# Patient Record
Sex: Male | Born: 1965 | Race: White | Hispanic: No | Marital: Single | Smoking: Current every day smoker
Health system: Southern US, Community
[De-identification: ages and names within clinical notes are randomized; demographics above are authoritative.]

## PROBLEM LIST (undated history)

## (undated) DIAGNOSIS — R000 Tachycardia, unspecified: Secondary | ICD-10-CM

## (undated) DIAGNOSIS — J45909 Unspecified asthma, uncomplicated: Secondary | ICD-10-CM

## (undated) DIAGNOSIS — I10 Essential (primary) hypertension: Secondary | ICD-10-CM

---

## 2013-03-01 ENCOUNTER — Emergency Department (HOSPITAL_COMMUNITY)
Admission: EM | Admit: 2013-03-01 | Discharge: 2013-03-01 | Disposition: A | Discharge status: Home / Self Care | Payer: 59 | Attending: Emergency Medicine | Admitting: Emergency Medicine

## 2013-03-01 ENCOUNTER — Emergency Department (HOSPITAL_COMMUNITY): Payer: 59

## 2013-03-01 ENCOUNTER — Encounter (HOSPITAL_COMMUNITY): Payer: Self-pay | Admitting: Emergency Medicine

## 2013-03-01 DIAGNOSIS — T148XXA Other injury of unspecified body region, initial encounter: Secondary | ICD-10-CM

## 2013-03-01 DIAGNOSIS — S0100XA Unspecified open wound of scalp, initial encounter: Secondary | ICD-10-CM | POA: Insufficient documentation

## 2013-03-01 DIAGNOSIS — S0003XA Contusion of scalp, initial encounter: Secondary | ICD-10-CM | POA: Insufficient documentation

## 2013-03-01 DIAGNOSIS — I10 Essential (primary) hypertension: Secondary | ICD-10-CM | POA: Insufficient documentation

## 2013-03-01 DIAGNOSIS — S0180XA Unspecified open wound of other part of head, initial encounter: Secondary | ICD-10-CM | POA: Insufficient documentation

## 2013-03-01 DIAGNOSIS — S0083XA Contusion of other part of head, initial encounter: Secondary | ICD-10-CM

## 2013-03-01 DIAGNOSIS — F17200 Nicotine dependence, unspecified, uncomplicated: Secondary | ICD-10-CM | POA: Insufficient documentation

## 2013-03-01 DIAGNOSIS — S1093XA Contusion of unspecified part of neck, initial encounter: Secondary | ICD-10-CM

## 2013-03-01 DIAGNOSIS — IMO0002 Reserved for concepts with insufficient information to code with codable children: Secondary | ICD-10-CM

## 2013-03-01 DIAGNOSIS — Z23 Encounter for immunization: Secondary | ICD-10-CM | POA: Insufficient documentation

## 2013-03-01 DIAGNOSIS — R55 Syncope and collapse: Secondary | ICD-10-CM | POA: Insufficient documentation

## 2013-03-01 HISTORY — DX: Tachycardia, unspecified: R00.0

## 2013-03-01 HISTORY — DX: Essential (primary) hypertension: I10

## 2013-03-01 MED ORDER — TETANUS-DIPHTH-ACELL PERTUSSIS 5-2.5-18.5 LF-MCG/0.5 IM SUSP
0.5000 mL | Freq: Once | INTRAMUSCULAR | Status: AC
  Administered 2013-03-01: 0.5 mL via INTRAMUSCULAR
  Filled 2013-03-01: qty 0.5
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
## 2013-03-01 NOTE — Discharge Instructions (Signed)
Abrasion An abrasion is a cut or scrape of the skin. Abrasions do not extend through all layers of the skin and most heal within 10 days. It is important to care for your abrasion properly to prevent infection. CAUSES  Most abrasions are caused by falling on, or gliding across, the ground or other surface. When your skin rubs on something, the outer and inner layer of skin rubs off, causing an abrasion. DIAGNOSIS  Your caregiver will be able to diagnose an abrasion during a physical exam.  TREATMENT  Your treatment depends on how large and deep the abrasion is. Generally, your abrasion will be cleaned with water and a mild soap to remove any dirt or debris. An antibiotic ointment may be put over the abrasion to prevent an infection. A bandage (dressing) may be wrapped around the abrasion to keep it from getting dirty.  You may need a tetanus shot if:  You cannot remember when you had your last tetanus shot.  You have never had a tetanus shot.  The injury broke your skin. If you get a tetanus shot, your arm may swell, get red, and feel warm to the touch. This is common and not a problem. If you need a tetanus shot and you choose not to have one, there is a rare chance of getting tetanus. Sickness from tetanus can be serious.  HOME CARE INSTRUCTIONS   If a dressing was applied, change it at least once a day or as directed by your caregiver. If the bandage sticks, soak it off with warm water.   Wash the area with water and a mild soap to remove all the ointment 2 times a day. Rinse off the soap and pat the area dry with a clean towel.   Reapply any ointment as directed by your caregiver. This will help prevent infection and keep the bandage from sticking. Use gauze over the wound and under the dressing to help keep the bandage from sticking.   Change your dressing right away if it becomes wet or dirty.   Only take over-the-counter or prescription medicines for pain, discomfort, or fever as  directed by your caregiver.   Follow up with your caregiver within 24 48 hours for a wound check, or as directed. If you were not given a wound-check appointment, look closely at your abrasion for redness, swelling, or pus. These are signs of infection. SEEK IMMEDIATE MEDICAL CARE IF:   You have increasing pain in the wound.   You have redness, swelling, or tenderness around the wound.   You have pus coming from the wound.   You have a fever or persistent symptoms for more than 2 3 days.  You have a fever and your symptoms suddenly get worse.  You have a bad smell coming from the wound or dressing.  MAKE SURE YOU:   Understand these instructions.  Will watch your condition.  Will get help right away if you are not doing well or get worse. Document Released: 09/27/2004 Document Revised: 12/05/2011 Document Reviewed: 11/21/2010 [facility] Patient Information 2014 [place], [place].  Head Injury, Adult You have received a head injury. It does not appear serious at this time. Headaches and vomiting are common following head injury. It should be easy to awaken from sleeping. Sometimes it is necessary for you to stay in the emergency department for a while for observation. Sometimes admission to the hospital may be needed. After injuries such as yours, most problems occur within the first 24 hours, but  side effects may occur up to 7 10 days after the injury. It is important for you to carefully monitor your condition and contact your health care provider or seek immediate medical care if there is a change in your condition. WHAT ARE THE TYPES OF HEAD INJURIES? Head injuries can be as minor as a bump. Some head injuries can be more severe. More severe head injuries include:  A jarring injury to the brain (concussion).  A bruise of the brain (contusion). This mean there is bleeding in the brain that can cause swelling.  A cracked skull (skull fracture).  Bleeding in the brain that  collects, clots, and forms a bump (hematoma). WHAT CAUSES A HEAD INJURY? A serious head injury is most likely to happen to someone who is in a car wreck and is not wearing a seat belt. Other causes of major head injuries include bicycle or motorcycle accidents, sports injuries, and falls. HOW ARE HEAD INJURIES DIAGNOSED? A complete history of the event leading to the injury and your current symptoms will be helpful in diagnosing head injuries. Many times, pictures of the brain, such as CT or MRI are needed to see the extent of the injury. Often, an overnight hospital stay is necessary for observation.  WHEN SHOULD I SEEK IMMEDIATE MEDICAL CARE?  You should get help right away if:  You have confusion or drowsiness.  You feel sick to your stomach (nauseous) or have continued, forceful vomiting.  You have dizziness or unsteadiness that is getting worse.  You have severe, continued headaches not relieved by medicine. Only take over-the-counter or prescription medicines for pain, fever, or discomfort as directed by your health care provider.  You do not have normal function of the arms or legs or are unable to walk.  You notice changes in the black spots in the center of the colored part of your eye (pupil).  You have a clear or bloody fluid coming from your nose or ears.  You have a loss of vision. During the next 24 hours after the injury, you must stay with someone who can watch you for the warning signs. This person should contact local emergency services (911 in the [place] if you have seizures, you become unconscious, or you are unable to wake up. HOW CAN I PREVENT A HEAD INJURY IN THE FUTURE? The most important factor for preventing major head injuries is avoiding motor vehicle accidents. To minimize the potential for damage to your head, it is crucial to wear seat belts while riding in motor vehicles. Wearing helmets while bike riding and playing collision sports (like football) is also  helpful. Also, avoiding dangerous activities around the house will further help reduce your risk of head injury.  WHEN CAN I RETURN TO NORMAL ACTIVITIES AND ATHLETICS? You should be reevaluated by your health care provider before returning to these activities. If you have any of the following symptoms, you should not return to activities or contact sports until 1 week after the symptoms have stopped:  Persistent headache.  Dizziness or vertigo.  Poor attention and concentration.  Confusion.  Memory problems.  Nausea or vomiting.  Fatigue or tire easily.  Irritability.  Intolerant of bright lights or loud noises.  Anxiety or depression.  Disturbed sleep. MAKE SURE YOU:   Understand these instructions.  Will watch your condition.  Will get help right away if you are not doing well or get worse. Document Released: 12/18/2004 Document Revised: 10/08/2012 Document Reviewed: 08/25/2012 [employer] Patient Information 2014  [employer]   Laceration Care, Adult A laceration is a cut that goes through all layers of the skin. The cut goes into the tissue beneath the skin. HOME CARE For stitches (sutures) or staples:  Keep the cut clean and dry.  If you have a bandage (dressing), change it at least once a day. Change the bandage if it gets wet or dirty, or as told by your doctor.  Wash the cut with soap and water 2 times a day. Rinse the cut with water. Pat it dry with a clean towel.  Put a thin layer of medicated cream on the cut as told by your doctor.  You may shower after the first 24 hours. Do not soak the cut in water until the stitches are removed.  Only take medicines as told by your doctor.  Have your stitches or staples removed as told by your doctor. For skin adhesive strips:  Keep the cut clean and dry.  Do not get the strips wet. You may take a bath, but be careful to keep the cut dry.  If the cut gets wet, pat it dry with a clean towel.  The strips  will fall off on their own. Do not remove the strips that are still stuck to the cut. For wound glue:  You may shower or take baths. Do not soak or scrub the cut. Do not swim. Avoid heavy sweating until the glue falls off on its own. After a shower or bath, pat the cut dry with a clean towel.  Do not put medicine on your cut until the glue falls off.  If you have a bandage, do not put tape over the glue.  Avoid lots of sunlight or tanning lamps until the glue falls off. Put sunscreen on the cut for the first year to reduce your scar.  The glue will fall off on its own. Do not pick at the glue. You may need a tetanus shot if:  You cannot remember when you had your last tetanus shot.  You have never had a tetanus shot. If you need a tetanus shot and you choose not to have one, you may get tetanus. Sickness from tetanus can be serious. GET HELP RIGHT AWAY IF:   Your pain does not get better with medicine.  Your arm, hand, leg, or foot loses feeling (numbness) or changes color.  Your cut is bleeding.  Your joint feels weak, or you cannot use your joint.  You have painful lumps on your body.  Your cut is red, puffy (swollen), or painful.  You have a red line on the skin near the cut.  You have yellowish-white fluid (pus) coming from the cut.  You have a fever.  You have a bad smell coming from the cut or bandage.  Your cut breaks open before or after stitches are removed.  You notice something coming out of the cut, such as wood or glass.  You cannot move a finger or toe. MAKE SURE YOU:   Understand these instructions.  Will watch your condition.  Will get help right away if you are not doing well or get worse. Document Released: 06/06/2007 Document Revised: 03/12/2011 Document Reviewed: 06/13/2010 [facility] Patient Information 2014 [place]
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
## 2014-09-04 ENCOUNTER — Emergency Department (HOSPITAL_COMMUNITY): Payer: 59

## 2014-09-04 ENCOUNTER — Encounter (HOSPITAL_COMMUNITY): Payer: Self-pay | Admitting: *Deleted

## 2014-09-04 ENCOUNTER — Emergency Department (HOSPITAL_COMMUNITY)
Admission: EM | Admit: 2014-09-04 | Discharge: 2014-09-04 | Disposition: A | Discharge status: Home / Self Care | Payer: 59 | Attending: Emergency Medicine | Admitting: Emergency Medicine

## 2014-09-04 DIAGNOSIS — J45901 Unspecified asthma with (acute) exacerbation: Secondary | ICD-10-CM | POA: Insufficient documentation

## 2014-09-04 DIAGNOSIS — Z720 Tobacco use: Secondary | ICD-10-CM | POA: Insufficient documentation

## 2014-09-04 DIAGNOSIS — J189 Pneumonia, unspecified organism: Secondary | ICD-10-CM

## 2014-09-04 DIAGNOSIS — Z79899 Other long term (current) drug therapy: Secondary | ICD-10-CM | POA: Diagnosis not present

## 2014-09-04 DIAGNOSIS — J159 Unspecified bacterial pneumonia: Secondary | ICD-10-CM | POA: Insufficient documentation

## 2014-09-04 DIAGNOSIS — R079 Chest pain, unspecified: Secondary | ICD-10-CM | POA: Diagnosis not present

## 2014-09-04 DIAGNOSIS — Z880 Allergy status to penicillin: Secondary | ICD-10-CM | POA: Diagnosis not present

## 2014-09-04 DIAGNOSIS — R0602 Shortness of breath: Secondary | ICD-10-CM | POA: Diagnosis present

## 2014-09-04 DIAGNOSIS — I10 Essential (primary) hypertension: Secondary | ICD-10-CM | POA: Diagnosis not present

## 2014-09-04 HISTORY — DX: Unspecified asthma, uncomplicated: J45.909

## 2014-09-04 MED ORDER — ALBUTEROL SULFATE (2.5 MG/3ML) 0.083% IN NEBU: 2.5000 mg | INHALATION_SOLUTION | Freq: Four times a day (QID) | RESPIRATORY_TRACT | Status: AC | PRN

## 2014-09-04 MED ORDER — PREDNISONE 20 MG PO TABS
60.0000 mg | ORAL_TABLET | Freq: Once | ORAL | Status: AC
  Administered 2014-09-04: 60 mg via ORAL
  Filled 2014-09-04: qty 3

## 2014-09-04 MED ORDER — IPRATROPIUM-ALBUTEROL 0.5-2.5 (3) MG/3ML IN SOLN
3.0000 mL | Freq: Once | RESPIRATORY_TRACT | Status: AC
  Administered 2014-09-04: 3 mL via RESPIRATORY_TRACT
  Filled 2014-09-04: qty 6

## 2014-09-04 MED ORDER — ALBUTEROL SULFATE HFA 108 (90 BASE) MCG/ACT IN AERS
2.0000 | INHALATION_SPRAY | Freq: Four times a day (QID) | RESPIRATORY_TRACT | Status: AC | PRN
Start: 2014-09-04 — End: ?

## 2014-09-04 MED ORDER — LEVOFLOXACIN 750 MG PO TABS
750.0000 mg | ORAL_TABLET | Freq: Once | ORAL | Status: AC
  Administered 2014-09-04: 750 mg via ORAL
  Filled 2014-09-04: qty 1

## 2014-09-04 MED ORDER — HYDROCODONE-HOMATROPINE 5-1.5 MG/5ML PO SYRP
5.0000 mL | ORAL_SOLUTION | Freq: Once | ORAL | Status: AC
  Administered 2014-09-04: 5 mL via ORAL
  Filled 2014-09-04: qty 5

## 2014-09-04 MED ORDER — IPRATROPIUM-ALBUTEROL 0.5-2.5 (3) MG/3ML IN SOLN
3.0000 mL | Freq: Once | RESPIRATORY_TRACT | Status: AC
  Administered 2014-09-04: 3 mL via RESPIRATORY_TRACT

## 2014-09-04 MED ORDER — DEXAMETHASONE SODIUM PHOSPHATE 10 MG/ML IJ SOLN: 10.0000 mg | Freq: Once | INTRAMUSCULAR | Status: DC

## 2014-09-04 MED ORDER — ALBUTEROL (5 MG/ML) CONTINUOUS INHALATION SOLN
10.0000 mg/h | INHALATION_SOLUTION | RESPIRATORY_TRACT | Status: AC
  Administered 2014-09-04: 10 mg/h via RESPIRATORY_TRACT
  Filled 2014-09-04 (×2): qty 20

## 2014-09-04 MED ORDER — LEVOFLOXACIN 750 MG PO TABS: 750.0000 mg | ORAL_TABLET | Freq: Every day | ORAL | Status: AC

## 2014-09-04 MED ORDER — ALBUTEROL SULFATE (2.5 MG/3ML) 0.083% IN NEBU
5.0000 mg | INHALATION_SOLUTION | Freq: Once | RESPIRATORY_TRACT | Status: AC
  Administered 2014-09-04: 5 mg via RESPIRATORY_TRACT
  Filled 2014-09-04: qty 6

## 2014-09-04 NOTE — ED Provider Notes (Signed)
CSN: [id number]     Arrival date & time 09/04/14  0220 History   First MD Initiated Contact with Patient 09/04/14 0310     Chief Complaint  Patient presents with  . Shortness of Breath     (Consider location/radiation/quality/duration/timing/severity/associated sxs/prior Treatment) HPI Comments: Patient is a 49 yo M PMHx significant for HTN, Asthma, tachycardia presenting to the ED for evaluation of SOB since Thursday. Patient states his symptoms initially started with productive cough, nasal congestion, and rhinorrhea after being around sick contacts at work. He states his symptoms continued to worsen. He developed wheezing and worsening shortness of breath today. He endorses chest soreness with coughing, deep breathing, or movement.   Patient is a 49 y.o. male presenting with cough. The history is provided by the patient.  Cough Cough characteristics:  Productive Sputum characteristics:  Yellow Severity:  Moderate Onset quality:  Gradual Duration:  5 days Timing:  Constant Progression:  Worsening Context: sick contacts   Relieved by:  Nothing Worsened by:  Nothing tried Ineffective treatments:  None tried Associated symptoms: chest pain, rhinorrhea, shortness of breath and wheezing   Associated symptoms: no fever     Past Medical History  Diagnosis Date  . Tachycardia   . Hypertension   . Asthma    History reviewed. No pertinent past surgical history. No family history on file. Social History  Substance Use Topics  . Smoking status: Current Every Day Smoker    Types: Cigarettes  . Smokeless tobacco: None  . Alcohol Use: Yes     Comment: occasional     Review of Systems  Constitutional: Negative for fever.  HENT: Positive for rhinorrhea.   Respiratory: Positive for cough, shortness of breath and wheezing.   Cardiovascular: Positive for chest pain.  All other systems reviewed and are negative.     Allergies  Penicillins  Home Medications   Prior to  Admission medications   Medication Sig Start Date End Date Taking? Authorizing Provider  acebutolol (SECTRAL) 200 MG capsule Take 200 mg by mouth daily.   Yes Historical Provider, MD  escitalopram (LEXAPRO) 20 MG tablet Take 20 mg by mouth daily.   Yes Historical Provider, MD  esomeprazole (NEXIUM) 20 MG capsule Take 20 mg by mouth daily at 12 noon.   Yes Historical Provider, MD  lisinopril (PRINIVIL,ZESTRIL) 10 MG tablet Take 10 mg by mouth daily.   Yes Historical Provider, MD  albuterol (PROVENTIL HFA;VENTOLIN HFA) 108 (90 BASE) MCG/ACT inhaler Inhale 2 puffs into the lungs every 6 (six) hours as needed for wheezing or shortness of breath. 09/04/14   Maziyah Vessel, PA-C  albuterol (PROVENTIL) (2.5 MG/3ML) 0.083% nebulizer solution Take 3 mLs (2.5 mg total) by nebulization every 6 (six) hours as needed for wheezing or shortness of breath. 09/04/14   Audrina Marten, PA-C  levofloxacin (LEVAQUIN) 750 MG tablet Take 1 tablet (750 mg total) by mouth daily. 09/05/14   Ruven Corradi, PA-C   BP 146/72 mmHg  Pulse 101  Temp(Src) 98.5 F (36.9 C) (Oral)  Resp 14  SpO2 91% Physical Exam  Constitutional: He is oriented to person, place, and time. He appears well-developed and well-nourished. No distress.  HENT:  Head: Normocephalic and atraumatic.  Right Ear: External ear normal.  Left Ear: External ear normal.  Nose: Nose normal.  Mouth/Throat: Oropharynx is clear and moist. No oropharyngeal exudate.  Eyes: Conjunctivae are normal.  Neck: Neck supple.  Cardiovascular: Normal rate, regular rhythm and normal heart sounds.   Pulmonary/Chest: Effort normal.  He has wheezes. He exhibits tenderness.  Examination after duoneb.  Productive cough on examination w/ yellow sputum  Abdominal: Soft. There is no tenderness.  Musculoskeletal: Normal range of motion. He exhibits no edema.  Neurological: He is alert and oriented to person, place, and time.  Skin: Skin is warm and dry. He is not  diaphoretic.  Nursing note and vitals reviewed.   ED Course  Procedures (including critical care time) Medications  albuterol (PROVENTIL,VENTOLIN) solution continuous neb (10 mg/hr Nebulization New Bag/Given 09/04/14 0440)  albuterol (PROVENTIL) (2.5 MG/3ML) 0.083% nebulizer solution 5 mg (5 mg Nebulization Given 09/04/14 0233)  ipratropium-albuterol (DUONEB) 0.5-2.5 (3) MG/3ML nebulizer solution 3 mL (3 mLs Nebulization Given 09/04/14 0252)  predniSONE (DELTASONE) tablet 60 mg (60 mg Oral Given 09/04/14 0246)  ipratropium-albuterol (DUONEB) 0.5-2.5 (3) MG/3ML nebulizer solution 3 mL (3 mLs Nebulization Given 09/04/14 0340)  HYDROcodone-homatropine (HYCODAN) 5-1.5 MG/5ML syrup 5 mL (5 mLs Oral Given 09/04/14 0335)  levofloxacin (LEVAQUIN) tablet 750 mg (750 mg Oral Given 09/04/14 0436)    Labs Review Labs Reviewed - No data to display  Imaging Review Dg Chest 2 View  09/04/2014   CLINICAL DATA:  Acute onset of central chest pain, shortness of breath and cough. Initial encounter.  EXAM: CHEST  2 VIEW  COMPARISON:  None.  FINDINGS: The lungs are well-aerated. Peribronchial thickening is noted. Mild left basilar airspace opacity likely reflects atelectasis, though pneumonia might have a similar appearance. There is no evidence of pleural effusion or pneumothorax.  The heart is normal in size; the mediastinal contour is within normal limits. No acute osseous abnormalities are seen.  IMPRESSION: Peribronchial thickening noted. Mild left basilar airspace opacity likely reflects atelectasis, though mild pneumonia might have a similar appearance.   Electronically Signed   By: Roanna Raider M.D.   On: 09/04/2014 03:30   I have personally reviewed and evaluated these images and lab results as part of my medical decision-making.   EKG Interpretation   Date/Time:  Saturday September 04 2014 02:39:53 EDT Ventricular Rate:  95 PR Interval:  144 QRS Duration: 89 QT Interval:  351 QTC Calculation: 441 R Axis:    75 Text Interpretation:  Sinus rhythm artifact noted No previous ECGs  available Confirmed by Bebe Shaggy  MD, DONALD ([id number]) on 09/04/2014 2:42:26 AM      MDM   Final diagnoses:  CAP (community acquired pneumonia)  Asthma exacerbation    Filed Vitals:   09/04/14 0445  BP: 146/72  Pulse: 101  Temp:   Resp: 14    Will sign out to Dr. Bebe Shaggy pending re-evaluation after CAT and re-ambulation with pulse ox monitoring. As patient is much improved, if he can ambulate and maintain O2 sats > 90% will d/c home with levaquin for CAP, inhaler, and nebulizer refills.   Patient d/w with Dr. Bebe Shaggy, agrees with plan.     Francee Piccolo, PA-C 09/04/14 6045  Zadie Rhine, MD 09/04/14 [phone]

## 2014-09-04 NOTE — ED Notes (Signed)
Discharge instructions and prescriptions reviewed, voiced understanding. 

## 2014-09-04 NOTE — ED Notes (Signed)
Oxygen 2l via [place] applied due to RA sats dropping to 88%.  Productive cough of thick yellow mucous.

## 2014-09-04 NOTE — ED Provider Notes (Signed)
Patient seen/examined in the Emergency Department in conjunction with Midlevel Provider  Patient reports cough and sob Exam : awake/alert, wheezing bilaterally Plan: treat with hour long neb and reassess   Zadie Rhine, MD 09/04/14 [phone]

## 2014-09-04 NOTE — Discharge Instructions (Signed)
Please follow up with your primary care physician in 1-2 days. If you do not have one please call the [facility] number listed above. Please take your antibiotic until completion. Please read all discharge instructions and return precautions.   Pneumonia Pneumonia is an infection of the lungs.  CAUSES Pneumonia may be caused by bacteria or a virus. Usually, these infections are caused by breathing infectious particles into the lungs (respiratory tract). SIGNS AND SYMPTOMS   Cough.  Fever.  Chest pain.  Increased rate of breathing.  Wheezing.  Mucus production. DIAGNOSIS  If you have the common symptoms of pneumonia, your health care provider will typically confirm the diagnosis with a chest X-ray. The X-ray will show an abnormality in the lung (pulmonary infiltrate) if you have pneumonia. Other tests of your blood, urine, or sputum may be done to find the specific cause of your pneumonia. Your health care provider may also do tests (blood gases or pulse oximetry) to see how well your lungs are working. TREATMENT  Some forms of pneumonia may be spread to other people when you cough or sneeze. You may be asked to wear a mask before and during your exam. Pneumonia that is caused by bacteria is treated with antibiotic medicine. Pneumonia that is caused by the influenza virus may be treated with an antiviral medicine. Most other viral infections must run their course. These infections will not respond to antibiotics.  HOME CARE INSTRUCTIONS   Cough suppressants may be used if you are losing too much rest. However, coughing protects you by clearing your lungs. You should avoid using cough suppressants if you can.  Your health care provider may have prescribed medicine if he or she thinks your pneumonia is caused by bacteria or influenza. Finish your medicine even if you start to feel better.  Your health care provider may also prescribe an expectorant. This loosens the  mucus to be coughed up.  Take medicines only as directed by your health care provider.  Do not smoke. Smoking is a common cause of bronchitis and can contribute to pneumonia. If you are a smoker and continue to smoke, your cough may last several weeks after your pneumonia has cleared.  A cold steam vaporizer or humidifier in your room or home may help loosen mucus.  Coughing is often worse at night. Sleeping in a semi-upright position in a recliner or using a couple pillows under your head will help with this.  Get rest as you feel it is needed. Your body will usually let you know when you need to rest. PREVENTION A pneumococcal shot (vaccine) is available to prevent a common bacterial cause of pneumonia. This is usually suggested for:  People over 57 years old.  Patients on chemotherapy.  People with chronic lung problems, such as bronchitis or emphysema.  People with immune system problems. If you are over 65 or have a high risk condition, you may receive the pneumococcal vaccine if you have not received it before. In some countries, a routine influenza vaccine is also recommended. This vaccine can help prevent some cases of pneumonia.You may be offered the influenza vaccine as part of your care. If you smoke, it is time to quit. You may receive instructions on how to stop smoking. Your health care provider can provide medicines and counseling to help you quit. SEEK MEDICAL CARE IF: You have a fever. SEEK IMMEDIATE MEDICAL CARE IF:   Your illness becomes worse. This is especially true if you  are elderly or weakened from any other disease.  You cannot control your cough with suppressants and are losing sleep.  You begin coughing up blood.  You develop pain which is getting worse or is uncontrolled with medicines.  Any of the symptoms which initially brought you in for treatment are getting worse rather than better.  You develop shortness of breath or chest pain. MAKE SURE YOU:    Understand these instructions.  Will watch your condition.  Will get help right away if you are not doing well or get worse. Document Released: 12/18/2004 Document Revised: 05/04/2013 Document Reviewed: 03/09/2010 [facility] Patient Information 2015 [place], [place]. This information is not intended to replace advice given to you by your health care provider. Make sure you discuss any questions you have with your health care provider.  Asthma, Acute Bronchospasm Acute bronchospasm caused by asthma is also referred to as an asthma attack. Bronchospasm means your air passages become narrowed. The narrowing is caused by inflammation and tightening of the muscles in the air tubes (bronchi) in your lungs. This can make it hard to breathe or cause you to wheeze and cough. CAUSES Possible triggers are:  Animal dander from the skin, hair, or feathers of animals.  Dust mites contained in house dust.  Cockroaches.  Pollen from trees or grass.  Mold.  Cigarette or tobacco smoke.  Air pollutants such as dust, household cleaners, hair sprays, aerosol sprays, paint fumes, strong chemicals, or strong odors.  Cold air or weather changes. Cold air may trigger inflammation. Winds increase molds and pollens in the air.  Strong emotions such as crying or laughing hard.  Stress.  Certain medicines such as aspirin or beta-blockers.  Sulfites in foods and drinks, such as dried fruits and wine.  Infections or inflammatory conditions, such as a flu, cold, or inflammation of the nasal membranes (rhinitis).  Gastroesophageal reflux disease (GERD). GERD is a condition where stomach acid backs up into your esophagus.  Exercise or strenuous activity. SIGNS AND SYMPTOMS   Wheezing.  Excessive coughing, particularly at night.  Chest tightness.  Shortness of breath. DIAGNOSIS  Your health care provider will ask you about your medical history and perform a physical exam. A chest X-ray or blood  testing may be performed to look for other causes of your symptoms or other conditions that may have triggered your asthma attack. TREATMENT  Treatment is aimed at reducing inflammation and opening up the airways in your lungs. Most asthma attacks are treated with inhaled medicines. These include quick relief or rescue medicines (such as bronchodilators) and controller medicines (such as inhaled corticosteroids). These medicines are sometimes given through an inhaler or a nebulizer. Systemic steroid medicine taken by mouth or given through an IV tube also can be used to reduce the inflammation when an attack is moderate or severe. Antibiotic medicines are only used if a bacterial infection is present.  HOME CARE INSTRUCTIONS   Rest.  Drink plenty of liquids. This helps the mucus to remain thin and be easily coughed up. Only use caffeine in moderation and do not use alcohol until you have recovered from your illness.  Do not smoke. Avoid being exposed to secondhand smoke.  You play a critical role in keeping yourself in good health. Avoid exposure to things that cause you to wheeze or to have breathing problems.  Keep your medicines up-to-date and available. Carefully follow your health care provider's treatment plan.  Take your medicine exactly as prescribed.  When pollen or pollution  is bad, keep windows closed and use an air conditioner or go to places with air conditioning.  Asthma requires careful medical care. See your health care provider for a follow-up as advised. If you are more than [redacted] weeks pregnant and you were prescribed any new medicines, let your obstetrician know about the visit and how you are doing. Follow up with your health care provider as directed.  After you have recovered from your asthma attack, make an appointment with your outpatient doctor to talk about ways to reduce the likelihood of future attacks. If you do not have a doctor who manages your asthma, make an  appointment with a primary care doctor to discuss your asthma. SEEK IMMEDIATE MEDICAL CARE IF:   You are getting worse.  You have trouble breathing. If severe, call your local emergency services (911 in the [place]  You develop chest pain or discomfort.  You are vomiting.  You are not able to keep fluids down.  You are coughing up yellow, green, brown, or bloody sputum.  You have a fever and your symptoms suddenly get worse.  You have trouble swallowing. MAKE SURE YOU:   Understand these instructions.  Will watch your condition.  Will get help right away if you are not doing well or get worse. Document Released: 04/04/2006 Document Revised: 12/23/2012 Document Reviewed: 06/25/2012 [facility] Patient Information 2015 [place], [place]. This information is not intended to replace advice given to you by your health care provider. Make sure you discuss any questions you have with your health care provider.

## 2014-09-04 NOTE — ED Notes (Signed)
The pt is c/o sob since Thursday.  He has asthma and he has audible wheezes and he has a productive cough

## 2014-09-04 NOTE — ED Notes (Signed)
Patient ambulated 130feet without difficulty.  Sats dropped to 91% one time and then back to 94% and remained there.  Heart rate 130-140's during ambulation  Patient stated he feels a lot better and feels like he tolerated the ambulation this time better than last time.

## 2014-09-04 NOTE — ED Notes (Signed)
Ambulated pt in hallway, O2 sat: 89-90%.
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
## 2016-07-22 IMAGING — CR DG CHEST 2V
2 series · 2 of 2 positions shown · non-contrast
Comparison: None.

CLINICAL DATA: Acute onset of central chest pain, shortness of
breath and cough. Initial encounter.

EXAM:
CHEST  2 VIEW

[chest pa]
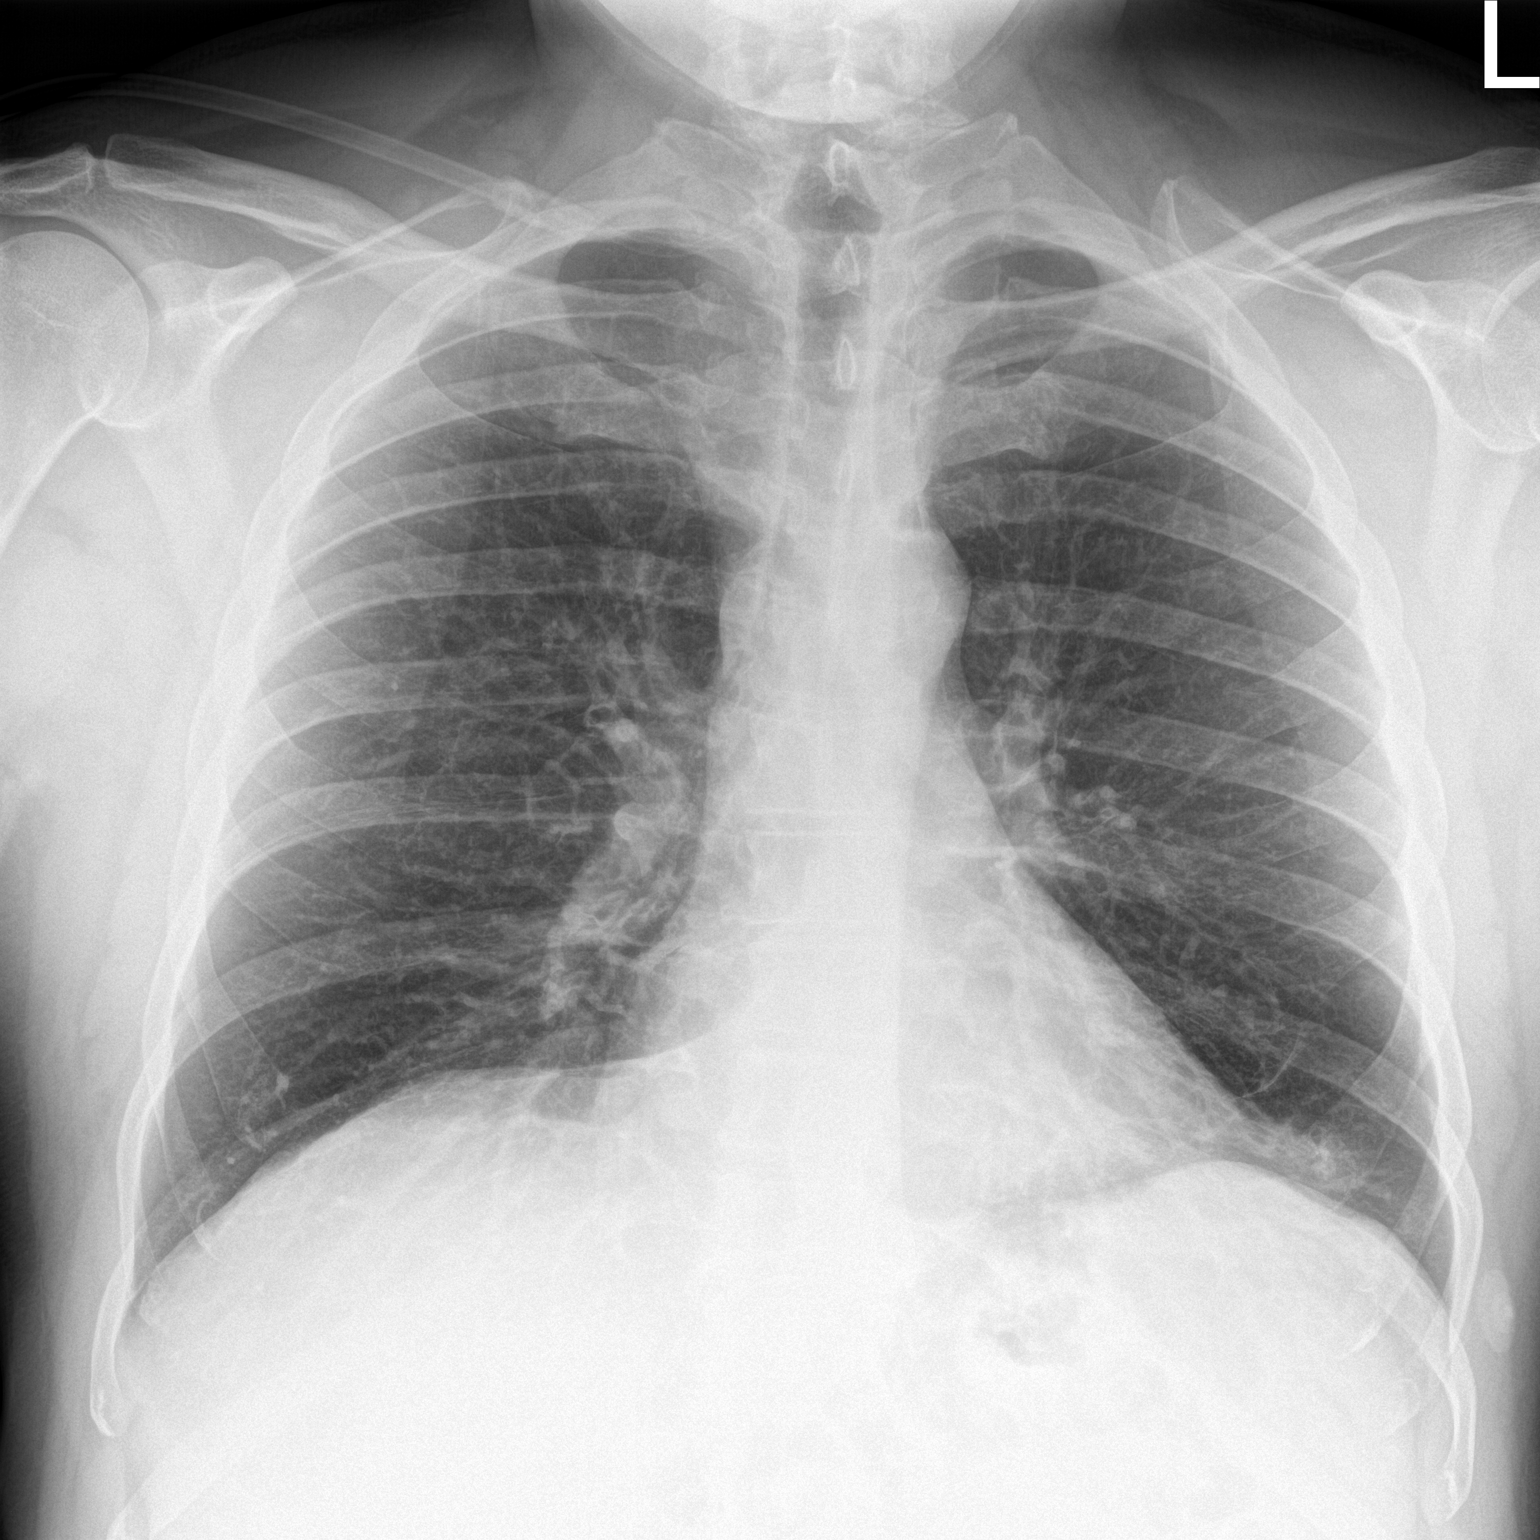

[chest lat]
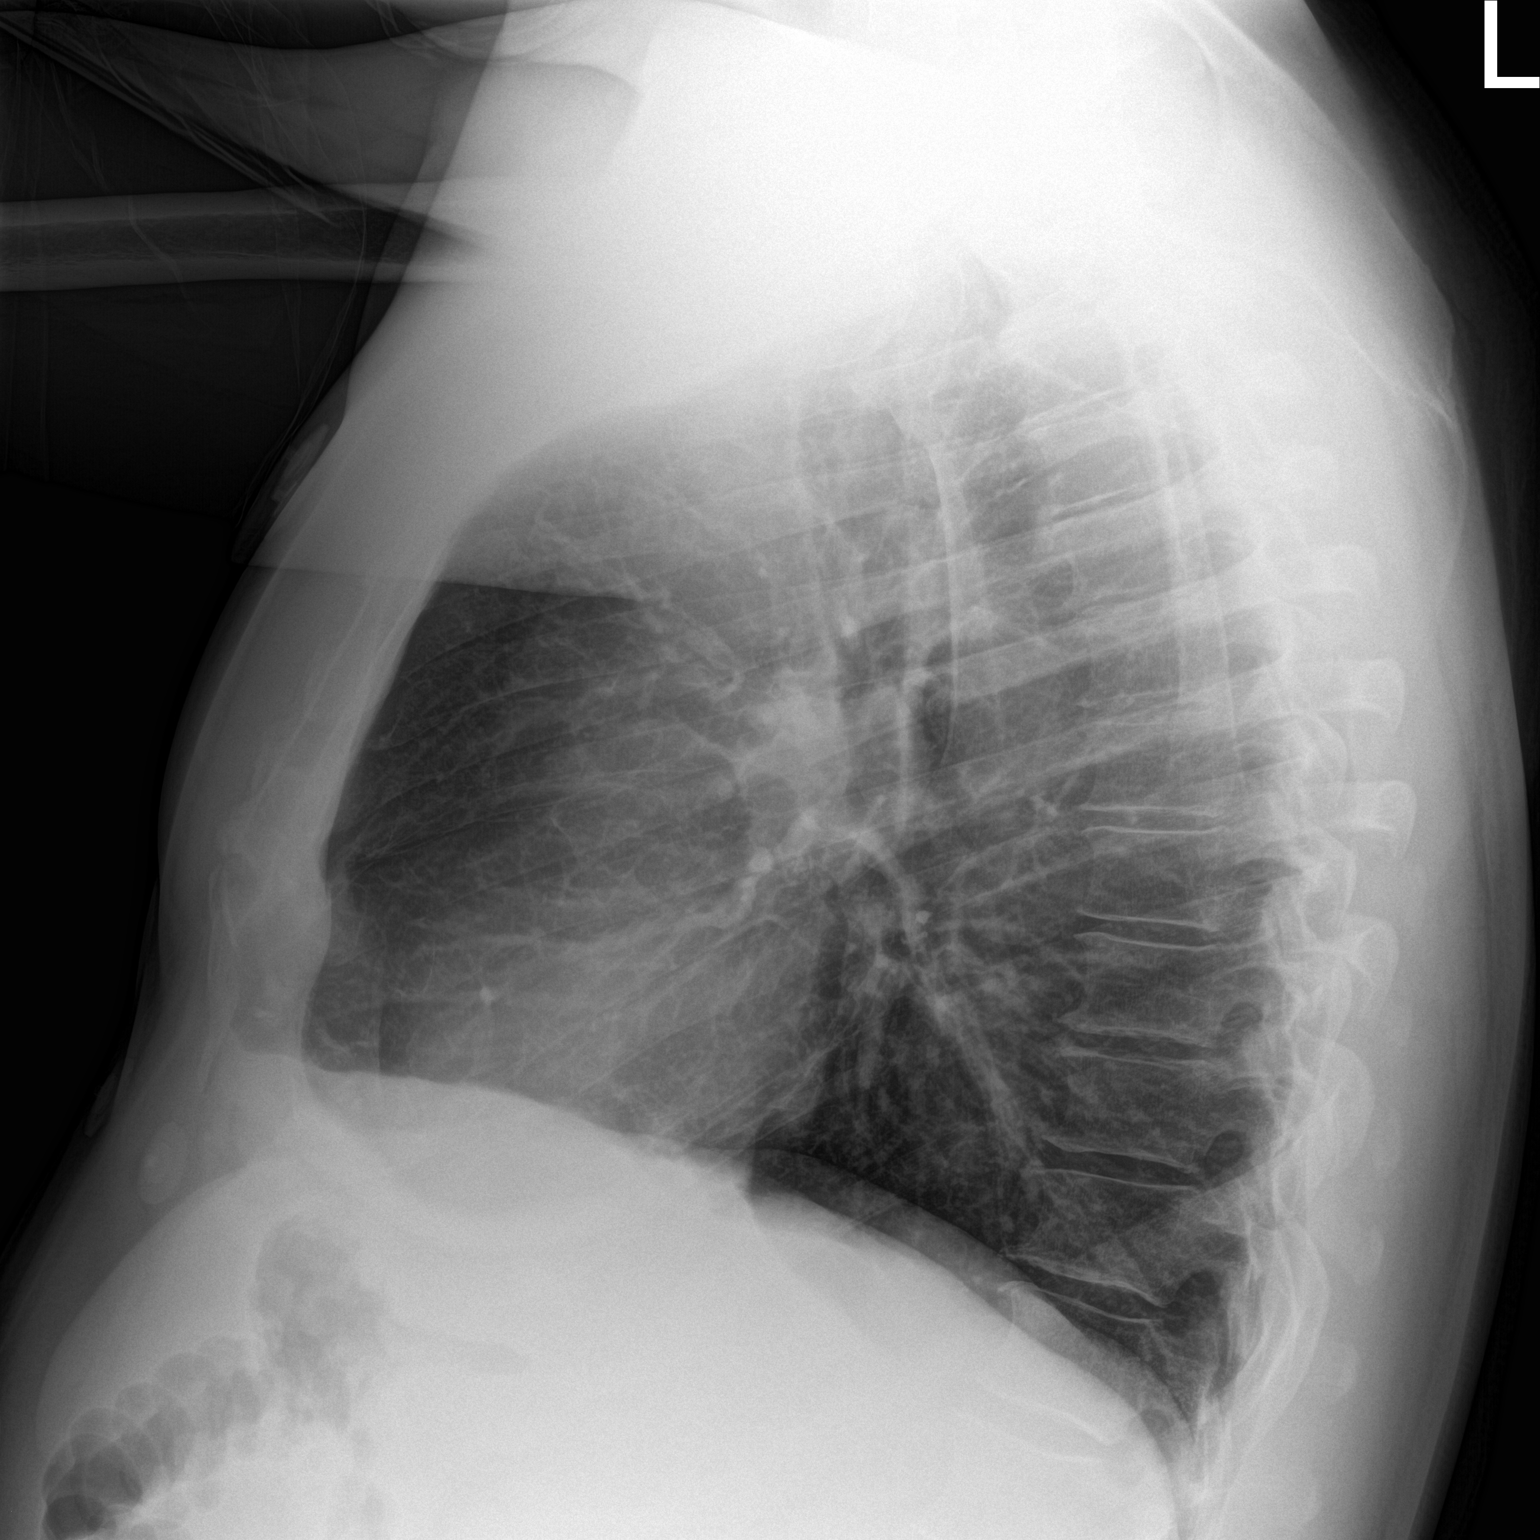

[2 of 2 positions shown; findings below may reference images not displayed]

FINDINGS: The lungs are well-aerated. Peribronchial thickening is noted. Mild
left basilar airspace opacity likely reflects atelectasis, though
pneumonia might have a similar appearance. There is no evidence of
pleural effusion or pneumothorax.

The heart is normal in size; the mediastinal contour is within
normal limits. No acute osseous abnormalities are seen.
IMPRESSION: Peribronchial thickening noted. Mild left basilar airspace opacity
likely reflects atelectasis, though mild pneumonia might have a
similar appearance.
